# Patient Record
Sex: Female | Born: 1950 | Race: White | Hispanic: No | Marital: Married | Smoking: Never smoker
Health system: Southern US, Community
[De-identification: ages and names within clinical notes are randomized; demographics above are authoritative.]

## PROBLEM LIST (undated history)

## (undated) DIAGNOSIS — I10 Essential (primary) hypertension: Secondary | ICD-10-CM

## (undated) DIAGNOSIS — E785 Hyperlipidemia, unspecified: Secondary | ICD-10-CM

## (undated) DIAGNOSIS — R7303 Prediabetes: Secondary | ICD-10-CM

## (undated) HISTORY — DX: Prediabetes: R73.03

## (undated) HISTORY — PX: LAPAROSCOPIC NEPHRECTOMY: SUR781

## (undated) HISTORY — PX: MOLE REMOVAL: SHX2046

## (undated) HISTORY — DX: Essential (primary) hypertension: I10

## (undated) HISTORY — DX: Hyperlipidemia, unspecified: E78.5

## (undated) HISTORY — PX: HERNIA REPAIR: SHX51

---

## 1999-08-01 ENCOUNTER — Other Ambulatory Visit: Admission: RE | Admit: 1999-08-01 | Discharge: 1999-08-01 | Payer: Self-pay | Admitting: Obstetrics and Gynecology

## 1999-08-23 ENCOUNTER — Encounter (INDEPENDENT_AMBULATORY_CARE_PROVIDER_SITE_OTHER): Payer: Self-pay | Admitting: Specialist

## 1999-08-23 ENCOUNTER — Ambulatory Visit (HOSPITAL_COMMUNITY): Admission: RE | Admit: 1999-08-23 | Discharge: 1999-08-23 | Payer: Self-pay | Admitting: Obstetrics and Gynecology

## 1999-09-25 ENCOUNTER — Encounter: Admission: RE | Admit: 1999-09-25 | Discharge: 1999-09-25 | Payer: Self-pay | Admitting: Obstetrics and Gynecology

## 1999-09-25 ENCOUNTER — Encounter: Payer: Self-pay | Admitting: Obstetrics and Gynecology

## 2000-08-12 ENCOUNTER — Other Ambulatory Visit: Admission: RE | Admit: 2000-08-12 | Discharge: 2000-08-12 | Payer: Self-pay | Admitting: Obstetrics and Gynecology

## 2001-11-22 ENCOUNTER — Other Ambulatory Visit: Admission: RE | Admit: 2001-11-22 | Discharge: 2001-11-22 | Payer: Self-pay | Admitting: Obstetrics and Gynecology

## 2001-11-24 ENCOUNTER — Encounter: Admission: RE | Admit: 2001-11-24 | Discharge: 2001-11-24 | Payer: Self-pay | Admitting: Urology

## 2001-11-24 ENCOUNTER — Encounter: Payer: Self-pay | Admitting: Urology

## 2001-12-20 ENCOUNTER — Encounter: Payer: Self-pay | Admitting: Urology

## 2001-12-27 ENCOUNTER — Encounter (INDEPENDENT_AMBULATORY_CARE_PROVIDER_SITE_OTHER): Payer: Self-pay | Admitting: Specialist

## 2001-12-27 ENCOUNTER — Inpatient Hospital Stay (HOSPITAL_COMMUNITY): Admission: RE | Admit: 2001-12-27 | Discharge: 2002-01-01 | Payer: Self-pay | Admitting: Urology

## 2003-01-10 ENCOUNTER — Other Ambulatory Visit: Admission: RE | Admit: 2003-01-10 | Discharge: 2003-01-10 | Payer: Self-pay | Admitting: Obstetrics and Gynecology

## 2004-10-30 ENCOUNTER — Encounter: Admission: RE | Admit: 2004-10-30 | Discharge: 2004-10-30 | Payer: Self-pay | Admitting: Obstetrics and Gynecology

## 2006-04-29 ENCOUNTER — Encounter: Admission: RE | Admit: 2006-04-29 | Discharge: 2006-04-29 | Payer: Self-pay | Admitting: Obstetrics and Gynecology

## 2007-05-04 ENCOUNTER — Encounter: Admission: RE | Admit: 2007-05-04 | Discharge: 2007-05-04 | Payer: Self-pay | Admitting: Obstetrics and Gynecology

## 2007-07-24 ENCOUNTER — Inpatient Hospital Stay (HOSPITAL_COMMUNITY): Admission: RE | Admit: 2007-07-24 | Discharge: 2007-07-25 | Payer: Self-pay | Admitting: General Surgery

## 2008-06-21 ENCOUNTER — Encounter: Admission: RE | Admit: 2008-06-21 | Discharge: 2008-06-21 | Payer: Self-pay | Admitting: Obstetrics and Gynecology

## 2009-07-04 ENCOUNTER — Encounter: Admission: RE | Admit: 2009-07-04 | Discharge: 2009-07-04 | Payer: Self-pay | Admitting: Obstetrics and Gynecology

## 2010-07-10 ENCOUNTER — Encounter: Admission: RE | Admit: 2010-07-10 | Discharge: 2010-07-10 | Payer: Self-pay | Admitting: Obstetrics

## 2010-07-22 ENCOUNTER — Encounter
Admission: RE | Admit: 2010-07-22 | Discharge: 2010-07-22 | Payer: Self-pay | Source: Home / Self Care | Attending: Obstetrics | Admitting: Obstetrics

## 2010-08-28 ENCOUNTER — Encounter
Admission: RE | Admit: 2010-08-28 | Discharge: 2010-08-28 | Payer: Self-pay | Source: Home / Self Care | Attending: Obstetrics and Gynecology | Admitting: Obstetrics and Gynecology

## 2010-12-24 NOTE — Discharge Summary (Signed)
NAME:  Sherri Kelley, Sherri Kelley                  ACCOUNT NO.:  [id number]   MEDICAL RECORD NO.:  [id number]          PATIENT TYPE:  OIB   LOCATION:  1534                         FACILITY:  [facility]   PHYSICIAN:  Ardeth Sportsman, MD     DATE OF BIRTH:  May 22, 1951   DATE OF ADMISSION:  07/23/2007  DATE OF DISCHARGE:                               DISCHARGE SUMMARY   PRIMARY CARE PHYSICIAN:  Dr. Evalee Jefferson at [facility].  That is the  facility she tends to go to.   UROLOGIST:  Bertram Millard. Dahlstedt, M.D.   SURGEON:  Dr. Jaclynn Guarneri.   DISCHARGE DIAGNOSIS:  Ventral and periumbilical incisional hernia.   PROCEDURES PERFORMED:  Laparoscopic ventral wall and periumbilical  hernia repair on July 23, 2007, by Dr. Johna Sheriff.  Other diagnoses  include prior C-section and status post right nephrectomy for  angiomyolipoma in 2003.   HOSPITAL COURSE:  Sherri Kelley is a pleasant 60 year old female who  developed an incisional hernia from her prior right nephrectomy as well  as a periumbilical hernia.  She underwent laparoscopic repair of this.  Postoperatively, she required IV pain medications.  However, by  postoperative day #2 she was walking the hallways well with adequate  control on oral ibuprofen and Percocet.  She was having flatus and  tolerating a solid diet.   The patient is improved and felt safe to be discharged home with the  following instructions:  1. She is to return to the clinic to see Dr. Johna Sheriff in about to      weeks.  2. She should remain physically active, doing low impact activity as      tolerated.  She should avoid any heavy lifting greater than 23      pounds for at least the next couple weeks, and avoid driving until      she is off narcotics and can actually protect herself on the road,      which probably will not be for at least 4-5 days.   She can discharge home with Tylox 1-2 p.o. q.4 h p.r.n. pain, Ibuprofen  400-800 mg p.o. q. 7 p.r.n. pain, Tylenol p.r.n. pain, ice pack  p.r.n.  pain, heating pad p.r.n. pain, abdominal binder or girdle p.r.n. pain.  She can resume her home medications to include the calcium and magnesium  and vitamin C daily as well as Echinacea b.i.d.   She should call if she has any fevers, chills, sweats, nausea, vomiting,  worsening of pain or other concerns.  She and her nurse agreed with this  plan.      Ardeth Sportsman, MD  Electronically Signed     SCG/MEDQ  D:  07/25/2007  T:  07/26/2007  Job:  [id number]

## 2010-12-24 NOTE — Op Note (Signed)
NAME:  Sherri Kelley, Sherri Kelley                  ACCOUNT NO.:  [id number]   MEDICAL RECORD NO.:  [id number]          PATIENT TYPE:  AMB   LOCATION:  DAY                          FACILITY:  [facility]   PHYSICIAN:  Sharlet Salina T. Hoxworth, M.D.DATE OF BIRTH:  11-21-1950   DATE OF PROCEDURE:  07/23/2007  DATE OF DISCHARGE:                               OPERATIVE REPORT   PREOPERATIVE DIAGNOSIS:  Ventral incisional and umbilical hernias.   POSTOPERATIVE DIAGNOSIS:  Ventral incisional and umbilical hernias.   SURGICAL PROCEDURE:  Laparoscopic repair of ventral incisional and  umbilical hernia.   SURGEON:  Lorne Skeens. Hoxworth, M.D.   ANESTHESIA:  General.   BRIEF HISTORY:  Sherri Kelley is a 60 year old female with a history of a  right nephrectomy through a right upper quadrant transverse incision  across the midline.  She recently presented with some intermittent mid  abdominal pain and a CT scan was performed.  This shows a moderate sized  3-4 cm midline ventral incisional hernia at the site of her previous  transverse incision as well as a small primary umbilical hernia.  Examination in the office reveals possibly a subtle mass near the hernia  although she is obese and physical exam is difficult.  With this  constellation of findings, I have recommended proceeding with  laparoscopic repair of both of these hernias.  The nature of the  procedure, indications, risks of bleeding, infection, possibly for open  repair and recurrence were discussed and understood.  She is now brought  to the operating room for this procedure.   DESCRIPTION OF OPERATION:  The patient was brought to the operating room  and placed in supine position on the operating table and general  orotracheal anesthesia was induced.  Foley catheter was placed.  She was  given preoperative IV antibiotics.  The abdomen was widely sterilely  prepped and draped.  Correct patient/procedure were verified.  Access  was obtained in the left upper  quadrant over toward the flank without  difficulty with an 11 mm Optiview trocar and pneumoperitoneum  established.  There were actually minimal adhesions of omentum up to the  old incision.  Under direct vision two 5 mm trocars were placed in the  left lateral abdomen.  Complete adhesiolysis was then performed taking  down omental adhesions from the right upper quadrant incision along its  entirety over toward the right flank.  Just at about the lower portion  of the falciform ligament where the incision crossed the midline there  was an approximately 3 cm diameter hernia defect.  A good sized portion  of preperitoneal fat was reduced from the defect and then the falciform  ligament was taken down 4 or 5 cm superiorly to allow fixation of the  mesh.  On careful palpation externally there was also confirmed to be a  small umbilical hernia.  Preperitoneal fat was taken down all around  this.  Preperitoneal fat reduced from the hernia defect in anticipation  of repair.  These 2 defects were close enough together that it seemed  most reasonable to repair both with a  single large piece of mesh rather  than 2 separate pieces.  This required a 20 x 15 cm oval piece of  Proceed.  Fixation sutures were placed around the edge at 6 points  equidistance.  Points on the anterior abdominal wall were initially  marked for these sutures.  The mesh was introduced into the abdominal  cavity, unfurled and oriented.  Initially the most superior and inferior  2 fixation points were used making tiny stab incisions and using the  Storz suture grabber to bring the superior and inferior midline sutures  up through the abdominal wall.  With these pulled taut I then discerned  the exact position of the other 4 lateral sutures on the abdominal wall  anteriorly and mesh was placed back down on the omentum and the Storz  suture grabber through small incisions again was used to retrieve the  final 4 sutures.  The mesh  was then brought up to the anterior abdominal  wall and the suture secured with very nice smooth deployment of the mesh  and wide coverage of the defects several centimeters at least 4  superiorly and inferiorly and a number of centimeters laterally.  Following this with the use of 2 additional 5 mm trocars in the right  flank to visualize the edge of the mesh circumferentially it was tacked  circumferentially with the Endo tacker and several tacks then placed  also more centrally around the larger defect superiorly.  This provided  nice broad coverage.  The abdomen was inspected for hemostasis.  This  appeared complete.  All CO2 was evacuated and trocars were removed.  The  skin incisions were closed with interrupted subcuticular 4-0 Monocryl  and Dermabond.  Sponges, needle and instrument counts were correct.  The  patient was taken to recovery in good condition.      Lorne Skeens. Hoxworth, M.D.  Electronically Signed     BTH/MEDQ  D:  07/23/2007  T:  07/24/2007  Job:  [id number]

## 2010-12-27 NOTE — Op Note (Signed)
[facility]  Patient:    Sherri Kelley                          MRN: [id number] Proc. Date: 08/23/99 Adm. Date:  [id number] Attending:  Morene Antu                           Operative Report  PREOPERATIVE DIAGNOSIS:       Menorrhagia, thickened endometrium.  POSTOPERATIVE DIAGNOSIS:      Menorrhagia, thickened endometrium.  OPERATION:                    D&C, hysteroscopy with resectoscopic excision of polypoid tissue.  SURGEON:                      Sherry A. Rosalio Macadamia, M.D.  ASSISTANT:  ANESTHESIA:                   MAC.  ESTIMATED BLOOD LOSS:  INDICATIONS:                  This is a 60 year old, gravida 2, para 2-0-0-2, woman who has had irregular and excessively heavy periods.  The patient was evaluated in the office after a menstrual period showing thickened endometrium with probable  endometrial polyps present.  Because of this, the patient is brought to the operating room for [facility] hysteroscopy with resectoscope.  FINDINGS:                     Normal size, anteflexed uterus with polypoid tissue in the endometrial cavity.  DESCRIPTION OF PROCEDURE:     The patient was brought into the operating room and given adequate IV sedation.  She was placed in the dorsal lithotomy position. er perineum and vagina were washed with Betadine.  The bladder was in-and-out catheterized.  The patient was draped in a sterile fashion.  Speculum was placed within the vagina.  The vagina was washed with Betadine.  Paracervical block was administered with 1% Nesocaine.  The anterior lip of the cervix was grasped with a single tooth tenaculum.  The cervix was sounded.  The cervix was dilated with Pratt dilators to a #31.  Hysteroscope was easily introduced into the endometrial cavity. Pictures were obtained.  Using a double loupe right angle resector at 190 watts, the thickened polypoid tissue was removed in sheets.  Small bleeders  were cauterized.  Pictures were obtained.  Adequate hemostasis was present.  All instruments were then removed from the vagina.  The patient was taken out of the dorsal lithotomy position.  She was awakened.  She was removed from the operating table to a stretcher in stable condition.  Complications were none.  Estimated blood loss was less than 5 cc.  Sorbitol differential -20 cc. DD:  08/23/99 TD:  08/24/99 Job: [id number]

## 2010-12-27 NOTE — Op Note (Signed)
[facility]  Patient:    Sherri Kelley, Sherri Kelley Visit Number: [id number] MRN: [id number]          Service Type: SUR Location: 3W 0363 01 Attending Physician:  Liborio Nixon Dictated by:   Bertram Millard. Dahlstedt, M.D. Proc. Date: 12/27/01 Admit Date:  12/27/2001   CC:         _____________   Operative Report  PREOPERATIVE DIAGNOSIS:  Large right renal mass.  POSTOPERATIVE DIAGNOSIS:  Large right renal mass.  PRINCIPAL PROCEDURE:  Attempted right partial, eventual right radical nephrectomy.  SURGEON:  Bertram Millard. Dahlstedt, M.D.  FIRST ASSISTANT:  Verl Dicker, M.D.  COMPLICATIONS:  None.  ANESTHESIA:  General endotracheal.  ESTIMATED BLOOD LOSS:  750 cc.  TOTAL FLUID REPLACEMENT:  2500 cc crystalloid, 500 Hespan.  BRIEF HISTORY:  This 60 year old female has a large right renal mass.  This is most likely an angiomyolipoma.  This was picked up on a physical exam by Dr. Floyde Parkins.  Follow-up ultrasound and CT revealed a 13-14 cm right lower pole mass.  CT characteristics revealed fat consistency.  It was felt that this most like an angiomyolipoma.  Interestingly, the patient has a sister, who I follow with an AML.  That one is small.  This one is so large, that I have recommended removing the tumor and possibly the kidney due to its large size and potential for bleeding. Risks and complications as well as alternatives of following this kidney and its mass were discussed with the patient and her husband at length.  They have decided to proceed.  They are aware of risks and complications including but no limited to bleeding, infection, need for transfusion, pneumonia, DVT, PE, other cardiac events.  They have decided to proceed.  DESCRIPTION OF PROCEDURE:  The patient was administered preoperative IV antibiotics taken to the operating room where general anesthetic was administered.  She was placed in the supine position with  a bump under the right flank.  An incision was made in the right subcostal area and carried down to peritoneum with electrocautery.  The mass was quite evident, protruding through the wound.  Colonic adhesions to the gallbladder were taken down with sharp dissection.  The right colon was mobilized.  The duodenum was Kocherized.  A huge right ovarian vein was seen coming into the vena cava. This was clipped and divided.  It was quite evident that there were very large veins circumnavigating the tumor.  Using gentle retraction, the vena cava was eventually identified. The entire lower pole of the mass was freed from the peritoneum with electrocautery.  The renal vessels were quite high, and despite having a good incision high in the patients right upper quadrant, it was still difficult to identify these vessels due to the size of the tumor and the kidney being pushed superiorly.  After approximately an hour of dissecting this kidney out and barely seeing the renal vessels, it was evident that the tumor was growing in size with some internal bleeding.  I felt it necessary at this point to proceed with a radical nephrectomy rather than a partial nephrectomy, as I worried about the length of time involved with this procedure as well as the amount of blood loss.  Also, exposure to the renal vessels was difficult.  At this point, we dissected up along the vena cava and encountered the right renal vein.  This was encircled with a 0 silk and used as traction.  The renal artery was  lying posterior to this renal vein.  It was identified and clipped as well as suture ligated proximally.  It was clipped distally, and the artery was divided.  The tie on the vein was then cinched down, and another tie was placed laterally on the renal vein.  A clip was placed medially.  The vein was then divided.  We then mobilized the rest of the lower pole mass.  Dissection was then carried also up medially toward  the upper pole of the kidney.  No other vessels were seen.  The adrenal was left intact.  Peritoneal attachments of the upper pole of the kidney were divided with electrocautery.  The lateral and inferior aspects of the kidney were then freed up.  The kidney and the mass were then passed from the table.  Small bleeders were carefully controlled with the clips or electrocautery.  A clip was placed on the divided ureter.  The renal fossa was irrigated with saline.  No bleeding was seen.  Abdominal contents were allowed to fall in their anatomic positions.  Closure of the fascia was then performed in two layers using running #1 PDS suture.  Skin edges were reapproximated using skin clips.  Dry sterile dressings were placed.  Sponge, needle, and instrument counts were correct x 2.  The patient tolerated the procedure well.  She was awakened, extubated, and taken to the PACU in stable condition. Dictated by:   Bertram Millard. Dahlstedt, M.D. Attending Physician:  Liborio Nixon DD:  12/27/01 TD:  12/28/01 Job: [id number]

## 2010-12-27 NOTE — Discharge Summary (Signed)
[facility]  Patient:    Sherri Kelley, Sherri Kelley Visit Number: [id number] MRN: [id number]          Service Type: SUR Location: 3W 0363 01 Attending Physician:  Liborio Nixon Dictated by:   Bertram Millard. Dahlstedt, M.D. Admit Date:  12/27/2001 Discharge Date: 01/01/2002   CC:         Cordelia Pen A. Rosalio Macadamia, M.D.   Discharge Summary  PRIMARY DIAGNOSIS:  Angiomyolipoma of right kidney.  PRINCIPAL PROCEDURE:  Right nephrectomy.  HISTORY OF PRESENT ILLNESS:  The patient is a 60 year old female with a right renal mass.  This was discovered by Dr. Rosalio Macadamia on routine physical examination.  Followup CT scan revealed a large right renal mass approximately 15 cm in size.  This had characteristics consistent with angiomyolipoma.  The patient underwent thorough evaluation, and no evidence of any metastatic disease was seen.  It was recommended that she undergo excision of this mass, and she presents at this time for that procedure.  HOSPITAL COURSE:  The patient was admitted directly to the operating room where she underwent right radial nephrectomy.  Partial nephrectomy was attempted, but the mass was so large this precluded attempts at selective tissue removal.  She did well, and postoperatively had a stable recovery.  She remained afebrile.  Incision was healing well.  She was advanced to a regular diet.  She was followed with her hematocrit being approximately 24% at the time of discharge.  She was in improved condition on 01/01/02, and was discharged at that time.  FOLLOWUP:  She will follow up in my office in approximately one week.  DISCHARGE MEDICATIONS:  Vicodin one or two p.o. q.4h. p.r.n. pain.  Activity instructions were discussed as well as wound care.Dictated by: Bertram Millard. Dahlstedt, M.D. Attending Physician:  Liborio Nixon DD:  01/31/02 TD:  02/01/02 Job: [id number]

## 2010-12-27 NOTE — H&P (Signed)
[facility]  Patient:    [name], Kelley Visit Number: [id number] MRN: [id number]          Service Type: SUR Location: 3W 0363 01 Attending Physician:  Sherri Kelley Dictated by:   Sherri Millard. Kelley, M.D. Admit Date:  12/27/2001   CC:         Sherri Kelley, M.D.   History and Physical  REASON FOR ADMISSION:  Right renal mass.  BRIEF HISTORY:  [age]-year-old female is admitted for surgical removal of a right renal mass.  This was discovered by Dr. Eliberto Ivory. Kelley on abdominal examination.  Followup studies revealed a 14-cm right renal mass; this was consistent with an angiomyolipoma.  The patient is aware of the benign but growing nature of this tumor.  The patient has a sister who I follow with an AML; that is much smaller.  Due to the size of this mass, it was recommended that the patient undergo excision of the tumor and possibly the kidney.  She presents at this time for that surgical procedure.  PAST MEDICAL HISTORY: 1. Cesarean delivery. 2. Vaginal delivery. 3. Status post D&C.  MEDICATIONS:  None.  ALLERGIES:  No known drug allergies.  SOCIAL HISTORY:  The patient is married.  She has two children who are teenagers.  There is no history of alcohol or tobacco use.  FAMILY HISTORY AND REVIEW OF SYSTEMS:  Noncontributory except for angiomyolipoma in her sister.  PHYSICAL EXAMINATION:  GENERAL:  Healthy-appearing, pleasant adult female.  VITAL SIGNS:  Blood pressure 110/60, pulse 80, respirations 16, temperature 98.7.  HEENT:  Normal.  NECK:  Supple without thyromegaly or adenopathy.  CHEST:  Clear bilaterally.  BREASTS:  Exam not performed.  HEART:  Regular rate and rhythm.  ABDOMEN:  Flat, soft, nondistended, nontender.  Large mobile mass in the right upper quadrant.  Bowel sounds present.  GU:  External genitalia normal.  EXTREMITIES:  Without cyanosis, clubbing or edema.  LABORATORY AND  ACCESSORY DATA:  Laboratory studies were normal.  Chest x-ray and EKG were normal.  IMPRESSION:  Large right renal mass, probable angiomyolipoma.  PLAN:  Possible right partial versus probable right total nephrectomy. Dictated by:   Sherri Millard. Kelley, M.D. Attending Physician:  Sherri Kelley DD:  12/27/01 TD:  12/28/01 Job: [id number]

## 2011-05-19 LAB — DIFFERENTIAL
Basophils Absolute: 0.1
Basophils Relative: 1
Lymphocytes Relative: 34
Monocytes Absolute: 0.7
Neutro Abs: 4.5
Neutrophils Relative %: 55

## 2011-05-19 LAB — COMPREHENSIVE METABOLIC PANEL
Albumin: 4.1
Alkaline Phosphatase: 133 — ABNORMAL HIGH
BUN: 13
Chloride: 105
Creatinine, Ser: 0.94
Glucose, Bld: 114 — ABNORMAL HIGH
Potassium: 4.6
Total Bilirubin: 1.1
Total Protein: 7.2

## 2011-05-19 LAB — URINALYSIS, ROUTINE W REFLEX MICROSCOPIC
Bilirubin Urine: NEGATIVE
Glucose, UA: NEGATIVE
Hgb urine dipstick: NEGATIVE
Ketones, ur: NEGATIVE
Protein, ur: NEGATIVE
Urobilinogen, UA: 0.2

## 2011-05-19 LAB — CBC
HCT: 39
Hemoglobin: 13.5
MCV: 86.5
Platelets: 281
RDW: 13

## 2011-06-24 ENCOUNTER — Other Ambulatory Visit: Payer: Self-pay | Admitting: Obstetrics

## 2011-06-24 DIAGNOSIS — Z1231 Encounter for screening mammogram for malignant neoplasm of breast: Secondary | ICD-10-CM

## 2011-08-01 ENCOUNTER — Ambulatory Visit
Admission: RE | Admit: 2011-08-01 | Discharge: 2011-08-01 | Disposition: A | Discharge status: Home / Self Care | Payer: BC Managed Care – PPO | Source: Ambulatory Visit | Attending: Obstetrics | Admitting: Obstetrics

## 2011-08-01 DIAGNOSIS — Z1231 Encounter for screening mammogram for malignant neoplasm of breast: Secondary | ICD-10-CM

## 2012-08-26 ENCOUNTER — Other Ambulatory Visit: Payer: Self-pay | Admitting: Obstetrics

## 2012-08-26 DIAGNOSIS — Z1231 Encounter for screening mammogram for malignant neoplasm of breast: Secondary | ICD-10-CM

## 2012-09-22 ENCOUNTER — Ambulatory Visit: Payer: BC Managed Care – PPO

## 2012-10-13 ENCOUNTER — Ambulatory Visit
Admission: RE | Admit: 2012-10-13 | Discharge: 2012-10-13 | Disposition: A | Discharge status: Home / Self Care | Payer: BC Managed Care – PPO | Source: Ambulatory Visit | Attending: Obstetrics | Admitting: Obstetrics

## 2012-10-13 DIAGNOSIS — Z1231 Encounter for screening mammogram for malignant neoplasm of breast: Secondary | ICD-10-CM

## 2013-09-15 ENCOUNTER — Other Ambulatory Visit: Payer: Self-pay

## 2013-09-15 DIAGNOSIS — Z1231 Encounter for screening mammogram for malignant neoplasm of breast: Secondary | ICD-10-CM

## 2013-10-19 ENCOUNTER — Ambulatory Visit: Payer: BC Managed Care – PPO

## 2014-01-11 ENCOUNTER — Encounter: Payer: BC Managed Care – PPO | Attending: Obstetrics | Admitting: Dietician

## 2014-01-11 ENCOUNTER — Encounter: Payer: Self-pay | Admitting: Dietician

## 2014-01-11 VITALS — Ht 63.0 in | Wt 202.6 lb

## 2014-01-11 DIAGNOSIS — E785 Hyperlipidemia, unspecified: Secondary | ICD-10-CM

## 2014-01-11 DIAGNOSIS — Z713 Dietary counseling and surveillance: Secondary | ICD-10-CM | POA: Insufficient documentation

## 2014-01-11 NOTE — Progress Notes (Signed)
  Medical Nutrition Therapy:  Appt start time: 945 end time:  1045.   Assessment:  Primary concerns today:  Sherri Kelley is here today with her husband. She states that her doctor referred her here for prediabetes. She lives with her husband; they have 2 adult children. 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 She eats out a "couple of times a week." At home her husband cooks and does the grocery shopping. [name] reports her dad and paternal grandfather had diabetes. She has recently done some reading about diabetes. Started taking phytomega and CoQ10 through a company called [employer]. She does not take any medications and wishes to avoid having to take medications in the future. Laporchia reports past success with Weight Watchers.  Preferred Learning Style:   No preference indicated   Learning Readiness:   Ready  MEDICATIONS: none; vitamin and mineral supplements only   DIETARY INTAKE:  Avoided foods include spicy foods.    24-hr recall:  B ( AM): egg on sandwich round with LF mayo OR yogurt and granola  Snk ( AM): fruit and nut bar  L ( PM): pimiento cheese or tuna sandwich or leftovers or Subway/Starbucks sandwich Snk ( PM): fruit D ( PM): baked cod or salmon with panko, roasted potatoes, coleslaw, asparagus or frozen pizza or spaghetti  Snk ( PM): popcorn, nuts, peanut butter crackers, cereal  Beverages: water, iced coffee, half sweet and half unsweet tea  Usual physical activity: lots of walking at work  Estimated energy needs: 1600-1800 calories 180-200 g carbohydrates 120-135 g protein 44-50 g fat  Progress Towards Goal(s):  No progress.   Nutritional Diagnosis:  Tucker-2.2 Altered nutrition-related laboratory As related to family history of diabetes, obesity, physical inactivity.  As evidenced by HgbA1c 6.2%.    Intervention:  Nutrition counseling provided. Discussed methods for preventing onset of diabetes. Goals:  -Start exercising  -Walking in the  evenings  -Look into a pedometer and set a step goal -Manage weight  -reduce snacking at night  -fill up on non-starchy vegetables -Watch portions!  -Practice reading food labels and measure portions  -Record intake   -Use small plates and bowls -Limit saturated fats -Healthy fats: fish, walnuts and almonds, avocados, olive oil -Increase fiber: fruits and vegetables, beans, whole grains  Teaching Method Utilized:  Visual Auditory  Handouts given during visit include:  MyPlate  Barriers to learning/adherence to lifestyle change: none  Demonstrated degree of understanding via:  Teach Back   Monitoring/Evaluation:  Dietary intake, exercise, and body weight in 2 month(s).

## 2014-01-11 NOTE — Patient Instructions (Addendum)
-  Start exercising  -Walking in the evenings  -Look into a pedometer and set a step goal  -Manage weight  -reduce snacking at night  -fill up on non-starchy vegetables  -Watch portions!  -Practice reading food labels and measure portions  -Record intake   -Use small plates and bowls  -Limit saturated fats -Healthy fats: fish, walnuts and almonds, avocados, olive oil  -Increase fiber: fruits and vegetables, beans, whole grains

## 2014-03-15 ENCOUNTER — Ambulatory Visit: Payer: BC Managed Care – PPO | Admitting: Dietician

## 2014-05-19 ENCOUNTER — Ambulatory Visit (HOSPITAL_COMMUNITY)
Admit: 2014-05-19 | Discharge: 2014-05-19 | Disposition: A | Discharge status: Home / Self Care | Payer: BC Managed Care – PPO | Source: Ambulatory Visit | Attending: Family Medicine | Admitting: Family Medicine

## 2014-05-19 ENCOUNTER — Emergency Department (INDEPENDENT_AMBULATORY_CARE_PROVIDER_SITE_OTHER)
Admission: EM | Admit: 2014-05-19 | Discharge: 2014-05-19 | Disposition: A | Discharge status: Home / Self Care | Payer: BC Managed Care – PPO | Source: Home / Self Care | Attending: Family Medicine | Admitting: Family Medicine

## 2014-05-19 ENCOUNTER — Encounter (HOSPITAL_COMMUNITY): Payer: Self-pay | Admitting: Emergency Medicine

## 2014-05-19 DIAGNOSIS — M940 Chondrocostal junction syndrome [Tietze]: Secondary | ICD-10-CM

## 2014-05-19 DIAGNOSIS — R0789 Other chest pain: Secondary | ICD-10-CM | POA: Diagnosis not present

## 2014-05-19 DIAGNOSIS — R1012 Left upper quadrant pain: Secondary | ICD-10-CM | POA: Diagnosis present

## 2014-05-19 LAB — POCT URINALYSIS DIP (DEVICE)
Bilirubin Urine: NEGATIVE
GLUCOSE, UA: NEGATIVE mg/dL
HGB URINE DIPSTICK: NEGATIVE
Ketones, ur: NEGATIVE mg/dL
Leukocytes, UA: NEGATIVE
NITRITE: NEGATIVE
PH: 7.5 (ref 5.0–8.0)
PROTEIN: NEGATIVE mg/dL
Specific Gravity, Urine: 1.015 (ref 1.005–1.030)
UROBILINOGEN UA: 0.2 mg/dL (ref 0.0–1.0)

## 2014-05-19 MED ORDER — TRAMADOL HCL 50 MG PO TABS: 50.0000 mg | ORAL_TABLET | Freq: Four times a day (QID) | ORAL | Status: DC | PRN

## 2014-05-19 NOTE — Discharge Instructions (Signed)
Thank you for coming in today. Take up to 2 Aleve twice daily for pain Take Tylenol for pain as well Use tramadol for severe pain. Do not drive after taking this medication.. Followup with primary care provider regarding your blood pressure.   PRIMARY CARE Paramedic at [place], [place] [phone]  Fax [phone]  Therapist, music at [facility] [address] [place], [place] Ph [phone]  Fax [phone]  Therapist, music at [place] / [place] [phone] [address], [place] Ph [phone]  Fax [phone]  [facility] at [facility] [address] [place]  [place], [place] Ph [phone]  Fax [phone]  [facility] [address] [place] [address] [place], [place] Ph [phone]  Fax [phone]  [facility] at [facility] [place], [place] Ph [phone]  Fax [phone]   [place] @ [place] [address] Phone: [phone]   [facility] @ [facility] [address] [place] [address] Phone: [place] @ [place] [place] [address] Phone: [place] @ [place]. [place] [address] Phone: [place] @ [place]. [employer], [place] [address] Phone: [phone]   [place] @ [place] [address] [place] [address] Phone: [phone]   Dr. Rachell Cipro [address] [place] [address] [phone]   Chest Wall Pain Chest wall pain is pain in or around the bones and muscles of your chest. It may take up to 6 weeks to get better. It may take longer if  you must stay physically active in your work and activities.  CAUSES  Chest wall pain may happen on its own. However, it may be caused by:  A viral illness like the flu.  Injury.  Coughing.  Exercise.  Arthritis.  Fibromyalgia.  Shingles. HOME CARE INSTRUCTIONS   Avoid overtiring physical activity. Try not to strain or perform activities that cause pain. This includes any activities using your chest or your abdominal and side muscles, especially if heavy weights are used.  Put ice on the sore area.  Put ice in a plastic bag.  Place a towel between your skin and the bag.  Leave the ice on for 15-20 minutes per hour while awake for the first 2 days.  Only take over-the-counter or prescription medicines for pain, discomfort, or fever as directed by your caregiver. SEEK IMMEDIATE MEDICAL CARE IF:   Your pain increases, or you are very uncomfortable.  You have a fever.  Your chest pain becomes worse.  You have new, unexplained symptoms.  You have nausea or vomiting.  You feel sweaty or lightheaded.  You have a cough with phlegm (sputum), or you cough up blood. MAKE SURE YOU:   Understand these instructions.  Will watch your condition.  Will get help right away if you are not doing well or get worse. Document Released: 07/28/2005 Document Revised: 10/20/2011 Document Reviewed: 03/24/2011 [facility] Patient Information 2015 [place], [place]. This information is not intended to replace advice given to you by your health care provider. Make sure you discuss any questions  you have with your health care provider.

## 2014-05-19 NOTE — ED Notes (Signed)
C/o left flank pain onset 1 week; yest night was worse Pain increases when breathing and w/pressure Denies f/v/n/d, urinary sx Alert, no signs of acute distress.

## 2014-05-19 NOTE — ED Notes (Signed)
Patient transported to X-ray 

## 2014-05-19 NOTE — ED Provider Notes (Signed)
Sherri Kelley is a 63 y.o. female who presents to Urgent Care today for left rib pain. Patient has a one-week history of left-sided flank to rib pain. The pain radiates around the left lower ribs laterally to anteriorly. The pain is worse with deep inspiration and better with rest. No exertional pain shortness of breath or palpitations. No rash. No fevers or chills. No medications tried yet. No urinary symptoms. Patient denies any injury history.  Past Medical History  Diagnosis Date  . Hyperlipidemia   . Prediabetes    History  Substance Use Topics  . Smoking status: Never Smoker   . Smokeless tobacco: Not on file  . Alcohol Use: Yes   ROS as above Medications: No current facility-administered medications for this encounter.   Current Outpatient Prescriptions  Medication Sig Dispense Refill  . traMADol (ULTRAM) 50 MG tablet Take 1 tablet (50 mg total) by mouth every 6 (six) hours as needed.  15 tablet  0    Exam:  BP 166/86  Pulse 74  Temp(Src) 97.1 F (36.2 C) (Oral)  Resp 16  SpO2 97% Gen: Well NAD HEENT: EOMI,  MMM Lungs: Normal work of breathing. CTABL Heart: RRR no MRG CHEST WALL : Symmetrical expansion bilaterally. Mildly tender to palpation left lower lateral ribs  Abd: NABS, Soft. Nondistended, Nontender Exts: Brisk capillary refill, warm and well perfused. Non-edematous. Nontender calves. Normal diameter equal bilaterally. Skin: No rash  Twelve-lead EKG shows normal sinus rhythm at 68 beats per minute. No ST segment elevation or depression. No significant Q waves.  Results for orders placed during the hospital encounter of 05/19/14 (from the past 24 hour(s))  POCT URINALYSIS DIP (DEVICE)     Status: None   Collection Time    05/19/14  9:44 AM      Result Value Ref Range   Glucose, UA NEGATIVE  NEGATIVE mg/dL   Bilirubin Urine NEGATIVE  NEGATIVE   Ketones, ur NEGATIVE  NEGATIVE mg/dL   Specific Gravity, Urine 1.015  1.005 - 1.030   Hgb urine dipstick NEGATIVE   NEGATIVE   pH 7.5  5.0 - 8.0   Protein, ur NEGATIVE  NEGATIVE mg/dL   Urobilinogen, UA 0.2  0.0 - 1.0 mg/dL   Nitrite NEGATIVE  NEGATIVE   Leukocytes, UA NEGATIVE  NEGATIVE   Dg Chest 2 View  05/19/2014   CLINICAL DATA:  Left upper abdominal and lower chest pain, pain with deep inspiration  EXAM: CHEST  2 VIEW  COMPARISON:  07/23/2007  FINDINGS: The heart size and mediastinal contours are within normal limits. Both lungs are clear. The visualized skeletal structures are unremarkable.  IMPRESSION: No active cardiopulmonary disease.   Electronically Signed   By: Kathreen Devoid   On: 05/19/2014 10:57    Assessment and Plan: 63 y.o. female with costochondritis most likely explanation.  Doubtful for serious chest pathology. Plan for tramadol home NSAIDs and Tylenol. Followup with PCP for blood pressure management.  Discussed warning signs or symptoms. Please see discharge instructions. Patient expresses understanding.     Gregor Hams, MD 05/19/14 [phone]

## 2014-07-11 ENCOUNTER — Encounter: Payer: Self-pay | Admitting: Internal Medicine

## 2014-07-11 ENCOUNTER — Other Ambulatory Visit (INDEPENDENT_AMBULATORY_CARE_PROVIDER_SITE_OTHER): Payer: BC Managed Care – PPO

## 2014-07-11 ENCOUNTER — Ambulatory Visit (INDEPENDENT_AMBULATORY_CARE_PROVIDER_SITE_OTHER): Payer: BC Managed Care – PPO | Admitting: Internal Medicine

## 2014-07-11 VITALS — BP 134/86 | HR 88 | Ht 63.0 in | Wt 209.2 lb

## 2014-07-11 DIAGNOSIS — J90 Pleural effusion, not elsewhere classified: Secondary | ICD-10-CM

## 2014-07-11 DIAGNOSIS — R05 Cough: Secondary | ICD-10-CM

## 2014-07-11 DIAGNOSIS — R059 Cough, unspecified: Secondary | ICD-10-CM

## 2014-07-11 LAB — CBC WITH DIFFERENTIAL/PLATELET
BASOS ABS: 0.1 10*3/uL (ref 0.0–0.1)
BASOS PCT: 0.6 % (ref 0.0–3.0)
EOS ABS: 0.2 10*3/uL (ref 0.0–0.7)
Eosinophils Relative: 2 % (ref 0.0–5.0)
HCT: 38.4 % (ref 36.0–46.0)
Hemoglobin: 12.7 g/dL (ref 12.0–15.0)
Lymphocytes Relative: 29.4 % (ref 12.0–46.0)
Lymphs Abs: 3 10*3/uL (ref 0.7–4.0)
MCHC: 33.1 g/dL (ref 30.0–36.0)
MCV: 87.8 fl (ref 78.0–100.0)
Monocytes Absolute: 0.9 10*3/uL (ref 0.1–1.0)
Monocytes Relative: 9 % (ref 3.0–12.0)
NEUTROS PCT: 59 % (ref 43.0–77.0)
Neutro Abs: 6 10*3/uL (ref 1.4–7.7)
Platelets: 312 10*3/uL (ref 150.0–400.0)
RBC: 4.37 Mil/uL (ref 3.87–5.11)
RDW: 12.7 % (ref 11.5–15.5)
WBC: 10.1 10*3/uL (ref 4.0–10.5)

## 2014-07-11 LAB — BRAIN NATRIURETIC PEPTIDE: Pro B Natriuretic peptide (BNP): 51 pg/mL (ref 0.0–100.0)

## 2014-07-11 LAB — SEDIMENTATION RATE: Sed Rate: 37 mm/hr — ABNORMAL HIGH (ref 0–22)

## 2014-07-11 NOTE — Patient Instructions (Signed)
Try pepcid ac 20 mg at bedtime along with   chlortrimeton (chlorpheniramine) 4 mg x 2 at bedtime to see if eliminates your night time cough  GERD (REFLUX)  is an extremely common cause of respiratory symptoms just like yours , many times with no obvious heartburn at all.    It can be treated with medication, but also with lifestyle changes including avoidance of late meals, excessive alcohol, smoking cessation, and avoid fatty foods, chocolate, peppermint, colas, red wine, and acidic juices such as orange juice.  NO MINT OR MENTHOL PRODUCTS SO NO COUGH DROPS  USE SUGARLESS CANDY INSTEAD (Jolley ranchers or Stover's or Life Savers) or even ice chips will also do - the key is to swallow to prevent all throat clearing. NO OIL BASED VITAMINS - use powdered substitutes.   Please see patient coordinator before you leave today  to schedule venous dopplers next available  Please remember to go to the lab  department downstairs for your tests - we will call you with the results when they are available.  Please schedule a follow up office visit in 2  weeks, sooner if needed with cxr

## 2014-07-11 NOTE — Progress Notes (Signed)
Quick Note:  Spoke with pt and notified of results per Dr. Wert. Pt verbalized understanding and denied any questions.  ______ 

## 2014-07-11 NOTE — Progress Notes (Signed)
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 Pulmonary Consult    Referred by Dr. Brayton Mars for eval of abnormal ct chest.  Pt states that she was having right side pain and so ct abd was done and then ct chest. Pt c/o non prod cough for the past month or two- non prod and only at night.   coughing x sev week prior to onset of classcially pleuritic L CP 05/19/14  with no fever or chills, minimal residual dry noct cough all that's bothering her now  Last beach trip middle September 2015 with no h/o leg swelling dvt or bcps or fm hx clotting  No obvious day to day or daytime variabilty or assoc chronic cough or cp or chest tightness, subjective wheeze overt sinus or hb symptoms. No unusual exp hx or h/o childhood pna/ asthma or knowledge of premature birth.  Sleeping ok without nocturnal  or early am exacerbation  of respiratory  c/o's or need for noct saba. Also denies any obvious fluctuation of symptoms with weather or environmental changes or other aggravating or alleviating factors except as outlined above   Current Medications, Allergies, Complete Past Medical History, Past Surgical History, Family History, and Social History were reviewed in [employer] record.  ROS  The following are not active complaints unless bolded sore throat, dysphagia, dental problems, itching, sneezing,  nasal congestion or excess/ purulent secretions, ear ache,   fever,  chills, sweats, unintended wt loss, pleuritic or exertional cp, hemoptysis,  orthopnea pnd or leg swelling, presyncope, palpitations, heartburn, abdominal pain, anorexia, nausea, vomiting, diarrhea  or change in bowel or urinary habits, change in stools or urine, dysuria,hematuria,  rash, arthralgias, visual complaints, headache, numbness weakness or ataxia or problems with walking or coordination,  change in mood/affect or memory.          Review of Systems     Objective:   Physical Exam amb wf nad  Wt Readings from Last 3 Encounters:  01/11/14 202 lb 9.6 oz (91.899 kg)    Vital signs reviewed   HEENT: nl dentition, turbinates, and orophanx. Nl external ear canals without cough reflex   NECK :  without JVD/Nodes/TM/ nl carotid upstrokes bilaterally   LUNGS: no acc muscle use, clear to A and P bilaterally without cough on insp or exp maneuvers   CV:  RRR  no s3 or murmur or increase in P2, no edema   ABD:  soft and nontender with nl excursion in the supine position. No bruits or organomegaly, bowel sounds nl  MS:  warm without deformities, calf tenderness, cyanosis or clubbing  SKIN: warm and dry without lesions    NEURO:  alert, approp, no deficits   cxr 05/19/14  No active cardiopulmonary disease.  Lab Results  Component Value Date   ESRSEDRATE 37* 07/11/2014     Lab Results  Component Value  Date   WBC 10.1 07/11/2014   HGB 12.7 07/11/2014   HCT 38.4 07/11/2014   MCV 87.8 07/11/2014   PLT 312.0 07/11/2014       Lab Results  Component Value Date   PROBNP 51.0 07/11/2014     Assessment:

## 2014-07-13 ENCOUNTER — Encounter: Payer: Self-pay | Admitting: Internal Medicine

## 2014-07-13 ENCOUNTER — Other Ambulatory Visit: Payer: BC Managed Care – PPO

## 2014-07-13 ENCOUNTER — Telehealth: Payer: Self-pay | Admitting: *Deleted

## 2014-07-13 DIAGNOSIS — R05 Cough: Secondary | ICD-10-CM | POA: Insufficient documentation

## 2014-07-13 DIAGNOSIS — J90 Pleural effusion, not elsewhere classified: Secondary | ICD-10-CM

## 2014-07-13 DIAGNOSIS — R059 Cough, unspecified: Secondary | ICD-10-CM | POA: Insufficient documentation

## 2014-07-13 LAB — D-DIMER, QUANTITATIVE: D-Dimer, Quant: 1.4 ug/mL-FEU — ABNORMAL HIGH (ref 0.00–0.48)

## 2014-07-13 NOTE — Telephone Encounter (Signed)
-----   Message from Tanda Rockers, MD sent at 07/13/2014  7:44 AM EST -----   ----- Message -----    From: Tanda Rockers, MD    Sent: 07/13/2014   7:06 AM      To: Tanda Rockers, MD  Make sure we either expedite the venous dopplers to this week or have the pt come by for d dimer today

## 2014-07-13 NOTE — Telephone Encounter (Signed)
Dopplers not set up until 07/19/14  Per MW needs to come in for d-dimer this wk LMTCB

## 2014-07-13 NOTE — Telephone Encounter (Signed)
Pt reports she went to lab and no order in. I have placed d dimer. Nothing further needed

## 2014-07-13 NOTE — Assessment & Plan Note (Signed)
Can be seen in pe but would be an unusual symptom in the absence of any doe at all  The most common causes of chronic cough in immunocompetent adults include the following: upper airway cough syndrome (UACS), previously referred to as postnasal drip syndrome (PNDS), which is caused by variety of rhinosinus conditions; (2) asthma; (3) GERD; (4) chronic bronchitis from cigarette smoking or other inhaled environmental irritants; (5) nonasthmatic eosinophilic bronchitis; and (6) bronchiectasis.   These conditions, singly or in combination, have accounted for up to 94% of the causes of chronic cough in prospective studies.   Other conditions have constituted no >6% of the causes in prospective studies These have included bronchogenic carcinoma, chronic interstitial pneumonia, sarcoidosis, left ventricular failure, ACEI-induced cough, and aspiration from a condition associated with pharyngeal dysfunction.    Chronic cough is often simultaneously caused by more than one condition. A single cause has been found from 38 to 82% of the time, multiple causes from 18 to 62%. Multiply caused cough has been the result of three diseases up to 42% of the time.       Most likely this is  Classic Upper airway cough syndrome, so named because it's frequently impossible to sort out how much is  CR/sinusitis with freq throat clearing (which can be related to primary GERD)   vs  causing  secondary (" extra esophageal")  GERD from wide swings in gastric pressure that occur with throat clearing, often  promoting self use of mint and menthol lozenges that reduce the lower esophageal sphincter tone and exacerbate the problem further in a cyclical fashion.   These are the same pts (now being labeled as having "irritable larynx syndrome" by some cough centers) who not infrequently have a history of having failed to tolerate ace inhibitors,  dry powder inhalers or biphosphonates or report having atypical reflux symptoms that don't  respond to standard doses of PPI , and are easily confused as having aecopd or asthma flares by even experienced allergists/ pulmonologists.  Try noct acid suppression/ h1 per guidelines and regroup in 2 weeks

## 2014-07-13 NOTE — Assessment & Plan Note (Signed)
Pattern in retrospect is worrisome for small peripheral PE, the hardest to detect even with CTa and d dimer, which are,even when acute, not sensitive enough to pick up the small ones.  The other ddx given the severe cough x 2 weeks prior is late parapneumonic or even traumatic hemothorax, but these seem less likely .   Best option now is eval legs to be sure doesn't have occult dvt and f/u in 2 weeks

## 2014-07-14 ENCOUNTER — Encounter (HOSPITAL_COMMUNITY): Payer: Self-pay

## 2014-07-14 ENCOUNTER — Other Ambulatory Visit: Payer: Self-pay | Admitting: Internal Medicine

## 2014-07-14 ENCOUNTER — Ambulatory Visit (HOSPITAL_COMMUNITY)
Admission: RE | Admit: 2014-07-14 | Discharge: 2014-07-14 | Disposition: A | Discharge status: Home / Self Care | Payer: BC Managed Care – PPO | Source: Ambulatory Visit | Attending: Internal Medicine | Admitting: Internal Medicine

## 2014-07-14 ENCOUNTER — Ambulatory Visit (HOSPITAL_COMMUNITY): Admission: RE | Admit: 2014-07-14 | Payer: BC Managed Care – PPO | Source: Ambulatory Visit

## 2014-07-14 DIAGNOSIS — J90 Pleural effusion, not elsewhere classified: Secondary | ICD-10-CM

## 2014-07-14 MED ORDER — IOHEXOL 350 MG/ML SOLN
100.0000 mL | Freq: Once | INTRAVENOUS | Status: AC | PRN
  Administered 2014-07-14: 100 mL via INTRAVENOUS

## 2014-07-14 NOTE — Progress Notes (Signed)
Quick Note:  Spoke with pt and notified of results per Dr. Wert. Pt verbalized understanding and denied any questions.  ______ 

## 2014-07-14 NOTE — Addendum Note (Signed)
Addended by: Inge Rise on: 07/14/2014 04:39 PM   Modules accepted: Orders

## 2014-07-14 NOTE — Progress Notes (Signed)
Quick Note:  LMTCB ______ 

## 2014-07-17 ENCOUNTER — Ambulatory Visit (HOSPITAL_COMMUNITY): Payer: BC Managed Care – PPO | Attending: Internal Medicine | Admitting: Cardiology

## 2014-07-17 DIAGNOSIS — R791 Abnormal coagulation profile: Secondary | ICD-10-CM

## 2014-07-17 DIAGNOSIS — J90 Pleural effusion, not elsewhere classified: Secondary | ICD-10-CM | POA: Diagnosis present

## 2014-07-17 DIAGNOSIS — R7989 Other specified abnormal findings of blood chemistry: Secondary | ICD-10-CM

## 2014-07-17 LAB — POCT I-STAT CREATININE: Creatinine, Ser: 1 mg/dL (ref 0.50–1.10)

## 2014-07-17 NOTE — Progress Notes (Signed)
Bilateral lower venous duplex performed  

## 2014-07-19 ENCOUNTER — Encounter (HOSPITAL_COMMUNITY): Payer: BC Managed Care – PPO

## 2014-07-19 NOTE — Progress Notes (Signed)
Quick Note:  Spoke with pt and notified of results per Dr. Wert. Pt verbalized understanding and denied any questions.  ______ 

## 2014-07-25 ENCOUNTER — Encounter: Payer: Self-pay | Admitting: Internal Medicine

## 2014-07-25 ENCOUNTER — Ambulatory Visit (INDEPENDENT_AMBULATORY_CARE_PROVIDER_SITE_OTHER): Payer: BC Managed Care – PPO | Admitting: Internal Medicine

## 2014-07-25 ENCOUNTER — Ambulatory Visit (INDEPENDENT_AMBULATORY_CARE_PROVIDER_SITE_OTHER)
Admission: RE | Admit: 2014-07-25 | Discharge: 2014-07-25 | Disposition: A | Discharge status: Home / Self Care | Payer: BC Managed Care – PPO | Source: Ambulatory Visit | Attending: Internal Medicine | Admitting: Internal Medicine

## 2014-07-25 VITALS — BP 130/78 | HR 70 | Ht 63.0 in | Wt 209.0 lb

## 2014-07-25 DIAGNOSIS — R05 Cough: Secondary | ICD-10-CM

## 2014-07-25 DIAGNOSIS — J90 Pleural effusion, not elsewhere classified: Secondary | ICD-10-CM

## 2014-07-25 DIAGNOSIS — R059 Cough, unspecified: Secondary | ICD-10-CM

## 2014-07-25 NOTE — Progress Notes (Signed)
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   History of Present Illness  07/11/2014 1st [facility] office visit/ Sherri Kelley   Chief Complaint  Patient presents with  . Pulmonary Consult    Referred by Dr. Brayton Mars for eval of abnormal ct chest.  Pt states that she was having right side pain and so ct abd was done and then ct chest. Pt c/o non prod cough for the past month or two- non prod and only at night.   coughing x sev week prior to onset of classcally pleuritic L CP 05/19/14  with no fever or chills, minimal residual dry noct cough all that's bothering her now Last beach trip middle September 2015 with no h/o leg swelling dvt or bcps or fm hx clotting rec Try pepcid ac 20 mg at bedtime along with   chlortrimeton (chlorpheniramine) 4 mg x 2 at bedtime to see if eliminates your night time cough  GERD . schedule venous dopplers next available> neg  Please remember to go to the lab  Department> pos d dimer > CTa > neg x small effusion/atx  07/25/2014 f/u ov/Antavius Sperbeck re:  Cough/ effusion / L cp  Chief Complaint  Patient presents with  . Follow-up    Cough has improved since the last visit. No new co's today.    Cough mostly daytime with voice use    No obvious day to day or daytime variabilty or assoc sob or cp or chest tightness, subjective wheeze overt sinus or hb symptoms. No unusual exp hx or h/o childhood pna/ asthma or knowledge of premature birth.  Sleeping ok without nocturnal  or early am exacerbation  of respiratory  c/o's or need for  noct saba. Also denies any obvious fluctuation of symptoms with weather or environmental changes or other aggravating or alleviating factors except as outlined above   Current Medications, Allergies, Complete Past Medical History, Past Surgical History, Family History, and Social History were reviewed in [employer] record.  ROS  The following are not active complaints unless bolded sore throat, dysphagia, dental problems, itching, sneezing,  nasal congestion or excess/ purulent secretions, ear ache,   fever, chills, sweats, unintended wt loss, pleuritic or exertional cp, hemoptysis,  orthopnea pnd or leg swelling, presyncope, palpitations, heartburn, abdominal pain, anorexia, nausea, vomiting, diarrhea  or change in bowel or urinary habits, change in stools or urine, dysuria,hematuria,  rash, arthralgias, visual complaints, headache, numbness weakness or ataxia or problems with walking or coordination,  change in mood/affect or memory.               Objective:   Physical Exam amb wf nad  Wt Readings from Last 3 Encounters:  07/25/14 209 lb (94.802 kg)  07/13/14 209 lb 3.2 oz (94.892 kg)  01/11/14 202 lb 9.6 oz (91.899 kg)    Vital signs reviewed       HEENT: nl dentition, turbinates, and orophanx. Nl external ear canals without cough reflex  NECK :  without JVD/Nodes/TM/ nl carotid upstrokes bilaterally   LUNGS: no acc muscle use, clear to A and P bilaterally without cough on insp or exp maneuvers   CV:  RRR  no s3 or murmur or increase in P2, no edema   ABD:  soft and nontender with nl excursion in the supine position. No bruits or organomegaly, bowel sounds nl  MS:  warm without deformities, calf tenderness, cyanosis or clubbing  SKIN: warm and dry without lesions       cxr 07/25/14  Mild left base subsegmental atelectasis and or infiltrate with small left pleural effusion.   Lab Results  Component Value Date   ESRSEDRATE 37* 07/11/2014      Lab Results  Component Value Date   WBC 10.1 07/11/2014   HGB 12.7 07/11/2014   HCT 38.4 07/11/2014   MCV 87.8 07/11/2014   PLT 312.0 07/11/2014       Lab Results  Component Value Date   PROBNP 51.0 07/11/2014     Assessment:

## 2014-07-25 NOTE — Patient Instructions (Addendum)
Please remember to go to the  x-ray department downstairs for your tests - we will call you with the results when they are available.    Late add f/u cxr at 6 weeks, sooner if needed (placed in tickle)

## 2014-07-25 NOTE — Progress Notes (Signed)
Quick Note:  Spoke with pt and notified of results per Dr. Melvyn Novas. Pt verbalized understanding and denied any questions. Pt will call back to schedule 4 wks, did not have time while I was speaking with her ______

## 2014-07-28 NOTE — Assessment & Plan Note (Signed)
Hx even now strongly supports upper airway source > improving on gerd RX  rec no change rx

## 2014-07-28 NOTE — Assessment & Plan Note (Signed)
Onset of cp 05/19/14 with nl cxr at that point  - CT abd 06/28/14 > small effusion - CTa Chest  07/14/2014 >Negative for significant acute pulmonary embolus. Persistent similar sized non loculated small left effusion with minor left lower lobe compressive atelectasis. - 07/19/2014 Venous dopplers neg bilaterally  - 07/25/2014 small residual effusion  I had an extended discussion with the patient reviewing all relevant studies completed to date and  lasting 15 to 20 minutes of a 25 minute visit on the following ongoing concerns:  By exclusion, this must be a small parapneumonic process or hemothorax/ traumatic/ from coughing but does not need further w/u at this point  Would rec final f/u cxr in 4-6 weeks

## 2014-08-18 ENCOUNTER — Telehealth: Payer: Self-pay | Admitting: *Deleted

## 2014-08-18 NOTE — Telephone Encounter (Signed)
I called and spoke with the pt and advised we need to go ahead and set up rov with cxr f/u pleural effusion  She verbalized understanding  States that she will need to call us back to reschedule  Nothing further needed

## 2014-08-18 NOTE — Telephone Encounter (Signed)
-----   Message from Tanda Rockers, MD sent at 07/28/2014  5:36 PM EST ----- F/u cxr Ok [place] due

## 2014-08-29 ENCOUNTER — Telehealth: Payer: Self-pay | Admitting: Internal Medicine

## 2014-08-29 DIAGNOSIS — J90 Pleural effusion, not elsewhere classified: Secondary | ICD-10-CM

## 2014-08-29 NOTE — Telephone Encounter (Signed)
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  Denies fever or sob.  Pt is due for ov with cxr with Dr Melvyn Novas. Ov scheduled 08/30/14.

## 2014-08-30 ENCOUNTER — Ambulatory Visit (INDEPENDENT_AMBULATORY_CARE_PROVIDER_SITE_OTHER)
Admission: RE | Admit: 2014-08-30 | Discharge: 2014-08-30 | Disposition: A | Discharge status: Home / Self Care | Payer: Self-pay | Source: Ambulatory Visit | Attending: Internal Medicine | Admitting: Internal Medicine

## 2014-08-30 ENCOUNTER — Encounter: Payer: Self-pay | Admitting: Internal Medicine

## 2014-08-30 ENCOUNTER — Ambulatory Visit (INDEPENDENT_AMBULATORY_CARE_PROVIDER_SITE_OTHER): Payer: BLUE CROSS/BLUE SHIELD | Admitting: Internal Medicine

## 2014-08-30 VITALS — BP 118/70 | HR 72 | Temp 98.5°F | Ht 63.0 in | Wt 206.0 lb

## 2014-08-30 DIAGNOSIS — R059 Cough, unspecified: Secondary | ICD-10-CM

## 2014-08-30 DIAGNOSIS — J90 Pleural effusion, not elsewhere classified: Secondary | ICD-10-CM

## 2014-08-30 DIAGNOSIS — R05 Cough: Secondary | ICD-10-CM

## 2014-08-30 MED ORDER — PREDNISONE 10 MG PO TABS: ORAL_TABLET | ORAL | Status: DC

## 2014-08-30 NOTE — Patient Instructions (Addendum)
Try prilosec 20mg   Take 30-60 min before first meal of the day and Pepcid 20 mg one bedtime until cough is completely gone for at least a week without the need for cough suppression  Best cough medication is delsym  GERD (REFLUX)  is an extremely common cause of respiratory symptoms just like yours , many times with no obvious heartburn at all.    It can be treated with medication, but also with lifestyle changes including avoidance of late meals, excessive alcohol, smoking cessation, and avoid fatty foods, chocolate, peppermint, colas, red wine, and acidic juices such as orange juice.  NO MINT OR MENTHOL PRODUCTS SO NO COUGH DROPS  USE SUGARLESS CANDY INSTEAD (Jolley ranchers or Stover's or Life Savers) or even ice chips will also do - the key is to swallow to prevent all throat clearing. NO OIL BASED VITAMINS - use powdered substitutes.    If all else fails > Prednisone 10 mg take  4 each am x 2 days,   2 each am x 2 days,  1 each am x 2 days and stop   Pulmonary f/u is as needed
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
## 2014-08-30 NOTE — Telephone Encounter (Signed)
Pt's appt was scheduled for 09/01/14 with MW. Pt came into office today (08/30/14) for cxr and appt with MW. Spoke with MW-ok to add pt to today's schedule and DB. Pt added to MW's schedule today. Nothing further needed.

## 2014-09-01 ENCOUNTER — Ambulatory Visit: Payer: Self-pay | Admitting: Internal Medicine

## 2014-09-02 ENCOUNTER — Encounter: Payer: Self-pay | Admitting: Internal Medicine

## 2014-09-02 NOTE — Assessment & Plan Note (Signed)
-   CT abd 06/28/14 > small effusion - CTa Chest  07/14/2014 >Negative for significant acute pulmonary embolus. Persistent similar sized non loculated small left effusion with minor left lower lobe compressive atelectasis. - 07/19/2014 Venous dopplers neg bilaterally  - 07/25/2014 small residual effusion  Most likely related to a parapneumonic process, not PE/ resolving s intervention

## 2014-09-02 NOTE — Assessment & Plan Note (Signed)
Explained the natural history of uri and why it's necessary in patients at risk to treat GERD aggressively - at least  short term -   to reduce risk of evolving cyclical cough initially  triggered by epithelial injury and a heightened sensitivty to the effects of any upper airway irritants,  most importantly acid - related - then perpetuated by epithelial injury related to the cough itself as the upper airway collapses on itself.  That is, the more sensitive the epithelium becomes once it is damaged by the virus, the more the ensuing irritability> the more the cough, the more the secondary reflux (especially in those prone to reflux) the more the irritation of the sensitive mucosa and so on in a  Classic cyclical pattern.   No further rx or f/u anticipated, we can see her prn

## 2015-06-23 ENCOUNTER — Encounter (HOSPITAL_BASED_OUTPATIENT_CLINIC_OR_DEPARTMENT_OTHER): Payer: Self-pay | Admitting: *Deleted

## 2015-06-23 ENCOUNTER — Emergency Department (HOSPITAL_BASED_OUTPATIENT_CLINIC_OR_DEPARTMENT_OTHER)
Admission: EM | Admit: 2015-06-23 | Discharge: 2015-06-23 | Disposition: A | Discharge status: Home / Self Care | Payer: BLUE CROSS/BLUE SHIELD | Attending: Emergency Medicine | Admitting: Emergency Medicine

## 2015-06-23 DIAGNOSIS — I10 Essential (primary) hypertension: Secondary | ICD-10-CM | POA: Diagnosis not present

## 2015-06-23 DIAGNOSIS — R55 Syncope and collapse: Secondary | ICD-10-CM | POA: Diagnosis not present

## 2015-06-23 DIAGNOSIS — Z8639 Personal history of other endocrine, nutritional and metabolic disease: Secondary | ICD-10-CM | POA: Diagnosis not present

## 2015-06-23 DIAGNOSIS — Z79899 Other long term (current) drug therapy: Secondary | ICD-10-CM | POA: Insufficient documentation

## 2015-06-23 LAB — CBC WITH DIFFERENTIAL/PLATELET
BASOS ABS: 0 10*3/uL (ref 0.0–0.1)
Basophils Relative: 0 %
EOS ABS: 0.2 10*3/uL (ref 0.0–0.7)
Eosinophils Relative: 2 %
HCT: 40.1 % (ref 36.0–46.0)
Hemoglobin: 13.3 g/dL (ref 12.0–15.0)
Lymphocytes Relative: 27 %
Lymphs Abs: 2.9 10*3/uL (ref 0.7–4.0)
MCH: 29.5 pg (ref 26.0–34.0)
MCHC: 33.2 g/dL (ref 30.0–36.0)
MCV: 88.9 fL (ref 78.0–100.0)
Monocytes Absolute: 0.9 10*3/uL (ref 0.1–1.0)
Monocytes Relative: 8 %
NEUTROS PCT: 63 %
Neutro Abs: 6.7 10*3/uL (ref 1.7–7.7)
Platelets: 305 10*3/uL (ref 150–400)
RBC: 4.51 MIL/uL (ref 3.87–5.11)
RDW: 12.6 % (ref 11.5–15.5)
WBC: 10.8 10*3/uL — AB (ref 4.0–10.5)

## 2015-06-23 LAB — TROPONIN I

## 2015-06-23 LAB — URINALYSIS, ROUTINE W REFLEX MICROSCOPIC
Bilirubin Urine: NEGATIVE
Glucose, UA: NEGATIVE mg/dL
Hgb urine dipstick: NEGATIVE
Ketones, ur: 15 mg/dL — AB
Nitrite: NEGATIVE
PROTEIN: NEGATIVE mg/dL
Specific Gravity, Urine: 1.009 (ref 1.005–1.030)
UROBILINOGEN UA: 0.2 mg/dL (ref 0.0–1.0)
pH: 6.5 (ref 5.0–8.0)

## 2015-06-23 LAB — BASIC METABOLIC PANEL
Anion gap: 9 (ref 5–15)
BUN: 24 mg/dL — ABNORMAL HIGH (ref 6–20)
CO2: 26 mmol/L (ref 22–32)
CREATININE: 0.9 mg/dL (ref 0.44–1.00)
Calcium: 9.8 mg/dL (ref 8.9–10.3)
Chloride: 105 mmol/L (ref 101–111)
GFR calc non Af Amer: >60 mL/min (ref >60)
Glucose, Bld: 101 mg/dL — ABNORMAL HIGH (ref 65–99)
Potassium: 4.2 mmol/L (ref 3.5–5.1)
SODIUM: 140 mmol/L (ref 135–145)

## 2015-06-23 LAB — URINE MICROSCOPIC-ADD ON

## 2015-06-23 LAB — CBG MONITORING, ED
Glucose-Capillary: 89 mg/dL (ref 65–99)
Glucose-Capillary: 90 mg/dL (ref 65–99)

## 2015-06-23 MED ORDER — SODIUM CHLORIDE 0.9 % IV BOLUS (SEPSIS): 1000.0000 mL | Freq: Once | INTRAVENOUS | Status: DC
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
## 2015-06-23 NOTE — Discharge Instructions (Signed)
Please follow with your primary care doctor in the next 2 days for a check-up. They must obtain records for further management.   Do not hesitate to return to the Emergency Department for any new, worsening or concerning symptoms.   Syncope Syncope is a medical term for fainting or passing out. This means you lose consciousness and drop to the ground. People are generally unconscious for less than 5 minutes. You may have some muscle twitches for up to 15 seconds before waking up and returning to normal. Syncope occurs more often in older adults, but it can happen to anyone. While most causes of syncope are not dangerous, syncope can be a sign of a serious medical problem. It is important to seek medical care.  CAUSES  Syncope is caused by a sudden drop in blood flow to the brain. The specific cause is often not determined. Factors that can bring on syncope include:  Taking medicines that lower blood pressure.  Sudden changes in posture, such as standing up quickly.  Taking more medicine than prescribed.  Standing in one place for too long.  Seizure disorders.  Dehydration and excessive exposure to heat.  Low blood sugar (hypoglycemia).  Straining to have a bowel movement.  Heart disease, irregular heartbeat, or other circulatory problems.  Fear, emotional distress, seeing blood, or severe pain. SYMPTOMS  Right before fainting, you may:  Feel dizzy or light-headed.  Feel nauseous.  See all white or all black in your field of vision.  Have cold, clammy skin. DIAGNOSIS  Your health care provider will ask about your symptoms, perform a physical exam, and perform an electrocardiogram (ECG) to record the electrical activity of your heart. Your health care provider may also perform other heart or blood tests to determine the cause of your syncope which may include:  Transthoracic echocardiogram (TTE). During echocardiography, sound waves are used to evaluate how blood flows through  your heart.  Transesophageal echocardiogram (TEE).  Cardiac monitoring. This allows your health care provider to monitor your heart rate and rhythm in real time.  Holter monitor. This is a portable device that records your heartbeat and can help diagnose heart arrhythmias. It allows your health care provider to track your heart activity for several days, if needed.  Stress tests by exercise or by giving medicine that makes the heart beat faster. TREATMENT  In most cases, no treatment is needed. Depending on the cause of your syncope, your health care provider may recommend changing or stopping some of your medicines. HOME CARE INSTRUCTIONS  Have someone stay with you until you feel stable.  Do not drive, use machinery, or play sports until your health care provider says it is okay.  Keep all follow-up appointments as directed by your health care provider.  Lie down right away if you start feeling like you might faint. Breathe deeply and steadily. Wait until all the symptoms have passed.  Drink enough fluids to keep your urine clear or pale yellow.  If you are taking blood pressure or heart medicine, get up slowly and take several minutes to sit and then stand. This can reduce dizziness. SEEK IMMEDIATE MEDICAL CARE IF:   You have a severe headache.  You have unusual pain in the chest, abdomen, or back.  You are bleeding from your mouth or rectum, or you have black or tarry stool.  You have an irregular or very fast heartbeat.  You have pain with breathing.  You have repeated fainting or seizure-like jerking during an  episode.  You faint when sitting or lying down.  You have confusion.  You have trouble walking.  You have severe weakness.  You have vision problems. If you fainted, call your local emergency services (911 in [place] Do not drive yourself to the hospital.    This information is not intended to replace advice given to you by your health care provider. Make  sure you discuss any questions you have with your health care provider.   Document Released: 07/28/2005 Document Revised: 12/12/2014 Document Reviewed: 09/26/2011 Elsevier Interactive Patient Education [employer].

## 2016-06-02 ENCOUNTER — Emergency Department (HOSPITAL_COMMUNITY)
Admission: EM | Admit: 2016-06-02 | Discharge: 2016-06-03 | Disposition: A | Discharge status: Home / Self Care | Payer: BLUE CROSS/BLUE SHIELD | Attending: Emergency Medicine | Admitting: Emergency Medicine

## 2016-06-02 ENCOUNTER — Emergency Department (HOSPITAL_COMMUNITY): Payer: BLUE CROSS/BLUE SHIELD

## 2016-06-02 ENCOUNTER — Encounter (HOSPITAL_COMMUNITY): Payer: Self-pay | Admitting: Emergency Medicine

## 2016-06-02 DIAGNOSIS — S52571A Other intraarticular fracture of lower end of right radius, initial encounter for closed fracture: Secondary | ICD-10-CM

## 2016-06-02 DIAGNOSIS — Y939 Activity, unspecified: Secondary | ICD-10-CM | POA: Diagnosis not present

## 2016-06-02 DIAGNOSIS — S59911A Unspecified injury of right forearm, initial encounter: Secondary | ICD-10-CM | POA: Diagnosis present

## 2016-06-02 DIAGNOSIS — Y929 Unspecified place or not applicable: Secondary | ICD-10-CM | POA: Insufficient documentation

## 2016-06-02 DIAGNOSIS — Y999 Unspecified external cause status: Secondary | ICD-10-CM | POA: Diagnosis not present

## 2016-06-02 DIAGNOSIS — I10 Essential (primary) hypertension: Secondary | ICD-10-CM | POA: Diagnosis not present

## 2016-06-02 DIAGNOSIS — K0889 Other specified disorders of teeth and supporting structures: Secondary | ICD-10-CM | POA: Diagnosis not present
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
## 2016-06-03 MED ORDER — OXYCODONE-ACETAMINOPHEN 5-325 MG PO TABS: 1.0000 | ORAL_TABLET | ORAL | 0 refills | Status: AC | PRN
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
## 2017-09-28 ENCOUNTER — Other Ambulatory Visit: Payer: Self-pay | Admitting: Obstetrics

## 2017-09-28 DIAGNOSIS — E2839 Other primary ovarian failure: Secondary | ICD-10-CM

## 2017-10-09 ENCOUNTER — Other Ambulatory Visit: Payer: BLUE CROSS/BLUE SHIELD
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
## 2018-10-01 ENCOUNTER — Other Ambulatory Visit: Payer: Self-pay | Admitting: Obstetrics

## 2018-10-01 DIAGNOSIS — E2839 Other primary ovarian failure: Secondary | ICD-10-CM

## 2018-10-01 DIAGNOSIS — Z780 Asymptomatic menopausal state: Secondary | ICD-10-CM

## 2019-10-13 ENCOUNTER — Ambulatory Visit: Payer: BLUE CROSS/BLUE SHIELD

## 2019-10-17 ENCOUNTER — Ambulatory Visit: Payer: Medicare (Managed Care) | Attending: Internal Medicine

## 2019-10-17 DIAGNOSIS — Z23 Encounter for immunization: Secondary | ICD-10-CM

## 2019-10-17 NOTE — Progress Notes (Signed)
   Covid-19 Vaccination Clinic  Name:  Sherri Kelley    MRN: [id number] DOB: July 29, 1951  10/17/2019  [name] was observed post Covid-19 immunization for 15 minutes without incident. She was provided with Vaccine Information Sheet and instruction to access the V-Safe system.   [name] was instructed to call 911 with any severe reactions post vaccine: Marland Kitchen Difficulty breathing  . Swelling of face and throat  . A fast heartbeat  . A bad rash all over body  . Dizziness and weakness   Immunizations Administered    Name Date Dose VIS Date Route   Pfizer COVID-19 Vaccine 10/17/2019  8:14 AM 0.3 mL 07/22/2019 Intramuscular   Manufacturer: [place]   Lot: [id number]   [place]: [id number]

## 2019-11-08 ENCOUNTER — Ambulatory Visit: Payer: Medicare (Managed Care) | Attending: Internal Medicine

## 2019-11-08 DIAGNOSIS — Z23 Encounter for immunization: Secondary | ICD-10-CM

## 2019-11-08 NOTE — Progress Notes (Signed)
   Covid-19 Vaccination Clinic  Name:  Sherri Kelley    MRN: [id number] DOB: 28-Aug-1950  11/08/2019  Ms. Sherri Kelley was observed post Covid-19 immunization for 15 minutes without incident. She was provided with Vaccine Information Sheet and instruction to access the V-Safe system.   Ms. Sherri Kelley was instructed to call 911 with any severe reactions post vaccine: Marland Kitchen Difficulty breathing  . Swelling of face and throat  . A fast heartbeat  . A bad rash all over body  . Dizziness and weakness   Immunizations Administered    Name Date Dose VIS Date Route   Pfizer COVID-19 Vaccine 11/08/2019  8:08 AM 0.3 mL 07/22/2019 Intramuscular   Manufacturer: [place]   Lot: [id number]   [place]: [id number]

## 2020-01-06 ENCOUNTER — Other Ambulatory Visit: Payer: Self-pay | Admitting: Obstetrics

## 2020-01-06 DIAGNOSIS — E2839 Other primary ovarian failure: Secondary | ICD-10-CM

## 2020-04-12 ENCOUNTER — Other Ambulatory Visit: Payer: Medicare (Managed Care)

## 2020-11-16 DIAGNOSIS — H43811 Vitreous degeneration, right eye: Secondary | ICD-10-CM | POA: Diagnosis not present

## 2020-11-16 DIAGNOSIS — H2513 Age-related nuclear cataract, bilateral: Secondary | ICD-10-CM | POA: Diagnosis not present

## 2020-11-21 DIAGNOSIS — H52209 Unspecified astigmatism, unspecified eye: Secondary | ICD-10-CM | POA: Diagnosis not present

## 2020-11-21 DIAGNOSIS — H5203 Hypermetropia, bilateral: Secondary | ICD-10-CM | POA: Diagnosis not present

## 2020-11-21 DIAGNOSIS — H524 Presbyopia: Secondary | ICD-10-CM | POA: Diagnosis not present

## 2021-01-08 DIAGNOSIS — Z0000 Encounter for general adult medical examination without abnormal findings: Secondary | ICD-10-CM | POA: Diagnosis not present

## 2021-01-08 DIAGNOSIS — Z1212 Encounter for screening for malignant neoplasm of rectum: Secondary | ICD-10-CM | POA: Diagnosis not present

## 2021-01-08 DIAGNOSIS — Z1329 Encounter for screening for other suspected endocrine disorder: Secondary | ICD-10-CM | POA: Diagnosis not present

## 2021-01-08 DIAGNOSIS — Z6833 Body mass index (BMI) 33.0-33.9, adult: Secondary | ICD-10-CM | POA: Diagnosis not present

## 2021-01-08 DIAGNOSIS — Z1211 Encounter for screening for malignant neoplasm of colon: Secondary | ICD-10-CM | POA: Diagnosis not present

## 2021-01-08 DIAGNOSIS — Z01411 Encounter for gynecological examination (general) (routine) with abnormal findings: Secondary | ICD-10-CM | POA: Diagnosis not present

## 2021-01-08 DIAGNOSIS — Z130 Encounter for screening for diseases of the blood and blood-forming organs and certain disorders involving the immune mechanism: Secondary | ICD-10-CM | POA: Diagnosis not present

## 2021-01-08 DIAGNOSIS — Z01419 Encounter for gynecological examination (general) (routine) without abnormal findings: Secondary | ICD-10-CM | POA: Diagnosis not present

## 2021-01-08 DIAGNOSIS — Z13220 Encounter for screening for lipoid disorders: Secondary | ICD-10-CM | POA: Diagnosis not present

## 2021-01-08 DIAGNOSIS — Z1231 Encounter for screening mammogram for malignant neoplasm of breast: Secondary | ICD-10-CM | POA: Diagnosis not present

## 2021-01-08 DIAGNOSIS — Z131 Encounter for screening for diabetes mellitus: Secondary | ICD-10-CM | POA: Diagnosis not present

## 2021-01-08 DIAGNOSIS — Z124 Encounter for screening for malignant neoplasm of cervix: Secondary | ICD-10-CM | POA: Diagnosis not present

## 2021-02-01 DIAGNOSIS — Z20828 Contact with and (suspected) exposure to other viral communicable diseases: Secondary | ICD-10-CM | POA: Diagnosis not present

## 2021-02-27 DIAGNOSIS — H2513 Age-related nuclear cataract, bilateral: Secondary | ICD-10-CM | POA: Diagnosis not present

## 2021-02-27 DIAGNOSIS — L821 Other seborrheic keratosis: Secondary | ICD-10-CM | POA: Diagnosis not present

## 2021-02-27 DIAGNOSIS — L578 Other skin changes due to chronic exposure to nonionizing radiation: Secondary | ICD-10-CM | POA: Diagnosis not present

## 2021-02-27 DIAGNOSIS — D225 Melanocytic nevi of trunk: Secondary | ICD-10-CM | POA: Diagnosis not present

## 2021-02-27 DIAGNOSIS — L814 Other melanin hyperpigmentation: Secondary | ICD-10-CM | POA: Diagnosis not present

## 2021-02-27 DIAGNOSIS — Q825 Congenital non-neoplastic nevus: Secondary | ICD-10-CM | POA: Diagnosis not present

## 2021-02-27 DIAGNOSIS — H43811 Vitreous degeneration, right eye: Secondary | ICD-10-CM | POA: Diagnosis not present

## 2021-04-02 DIAGNOSIS — E669 Obesity, unspecified: Secondary | ICD-10-CM | POA: Diagnosis not present

## 2021-04-02 DIAGNOSIS — Z823 Family history of stroke: Secondary | ICD-10-CM | POA: Diagnosis not present

## 2021-04-02 DIAGNOSIS — Z8249 Family history of ischemic heart disease and other diseases of the circulatory system: Secondary | ICD-10-CM | POA: Diagnosis not present

## 2021-04-02 DIAGNOSIS — R030 Elevated blood-pressure reading, without diagnosis of hypertension: Secondary | ICD-10-CM | POA: Diagnosis not present

## 2021-04-02 DIAGNOSIS — Z833 Family history of diabetes mellitus: Secondary | ICD-10-CM | POA: Diagnosis not present

## 2021-04-02 DIAGNOSIS — R69 Illness, unspecified: Secondary | ICD-10-CM | POA: Diagnosis not present

## 2021-04-02 DIAGNOSIS — Z6834 Body mass index (BMI) 34.0-34.9, adult: Secondary | ICD-10-CM | POA: Diagnosis not present

## 2021-11-21 DIAGNOSIS — R059 Cough, unspecified: Secondary | ICD-10-CM | POA: Diagnosis not present

## 2021-11-21 DIAGNOSIS — Z20822 Contact with and (suspected) exposure to covid-19: Secondary | ICD-10-CM | POA: Diagnosis not present

## 2021-11-21 DIAGNOSIS — J029 Acute pharyngitis, unspecified: Secondary | ICD-10-CM | POA: Diagnosis not present

## 2022-03-11 DIAGNOSIS — Q825 Congenital non-neoplastic nevus: Secondary | ICD-10-CM | POA: Diagnosis not present

## 2022-03-11 DIAGNOSIS — L719 Rosacea, unspecified: Secondary | ICD-10-CM | POA: Diagnosis not present

## 2022-03-11 DIAGNOSIS — L814 Other melanin hyperpigmentation: Secondary | ICD-10-CM | POA: Diagnosis not present

## 2022-03-11 DIAGNOSIS — D225 Melanocytic nevi of trunk: Secondary | ICD-10-CM | POA: Diagnosis not present

## 2022-03-11 DIAGNOSIS — L820 Inflamed seborrheic keratosis: Secondary | ICD-10-CM | POA: Diagnosis not present

## 2022-03-11 DIAGNOSIS — L578 Other skin changes due to chronic exposure to nonionizing radiation: Secondary | ICD-10-CM | POA: Diagnosis not present

## 2022-03-11 DIAGNOSIS — L821 Other seborrheic keratosis: Secondary | ICD-10-CM | POA: Diagnosis not present

## 2022-03-31 DIAGNOSIS — Z1212 Encounter for screening for malignant neoplasm of rectum: Secondary | ICD-10-CM | POA: Diagnosis not present

## 2022-03-31 DIAGNOSIS — Z124 Encounter for screening for malignant neoplasm of cervix: Secondary | ICD-10-CM | POA: Diagnosis not present

## 2022-03-31 DIAGNOSIS — Z1211 Encounter for screening for malignant neoplasm of colon: Secondary | ICD-10-CM | POA: Diagnosis not present

## 2022-03-31 DIAGNOSIS — Z01411 Encounter for gynecological examination (general) (routine) with abnormal findings: Secondary | ICD-10-CM | POA: Diagnosis not present

## 2022-03-31 DIAGNOSIS — Z0142 Encounter for cervical smear to confirm findings of recent normal smear following initial abnormal smear: Secondary | ICD-10-CM | POA: Diagnosis not present

## 2022-03-31 DIAGNOSIS — Z1231 Encounter for screening mammogram for malignant neoplasm of breast: Secondary | ICD-10-CM | POA: Diagnosis not present

## 2022-03-31 DIAGNOSIS — Z0000 Encounter for general adult medical examination without abnormal findings: Secondary | ICD-10-CM | POA: Diagnosis not present

## 2022-03-31 DIAGNOSIS — Z01419 Encounter for gynecological examination (general) (routine) without abnormal findings: Secondary | ICD-10-CM | POA: Diagnosis not present

## 2022-03-31 DIAGNOSIS — Z6830 Body mass index (BMI) 30.0-30.9, adult: Secondary | ICD-10-CM | POA: Diagnosis not present

## 2022-03-31 DIAGNOSIS — Z13820 Encounter for screening for osteoporosis: Secondary | ICD-10-CM | POA: Diagnosis not present

## 2022-04-01 ENCOUNTER — Other Ambulatory Visit: Payer: Self-pay | Admitting: Obstetrics

## 2022-04-01 DIAGNOSIS — Z13820 Encounter for screening for osteoporosis: Secondary | ICD-10-CM

## 2022-04-21 DIAGNOSIS — H2513 Age-related nuclear cataract, bilateral: Secondary | ICD-10-CM | POA: Diagnosis not present

## 2022-04-21 DIAGNOSIS — H18453 Nodular corneal degeneration, bilateral: Secondary | ICD-10-CM | POA: Diagnosis not present

## 2022-05-19 DIAGNOSIS — H2513 Age-related nuclear cataract, bilateral: Secondary | ICD-10-CM | POA: Diagnosis not present

## 2022-06-04 DIAGNOSIS — H2511 Age-related nuclear cataract, right eye: Secondary | ICD-10-CM | POA: Diagnosis not present

## 2022-06-04 DIAGNOSIS — H269 Unspecified cataract: Secondary | ICD-10-CM | POA: Diagnosis not present

## 2022-06-11 DIAGNOSIS — H2512 Age-related nuclear cataract, left eye: Secondary | ICD-10-CM | POA: Diagnosis not present

## 2022-06-11 DIAGNOSIS — H269 Unspecified cataract: Secondary | ICD-10-CM | POA: Diagnosis not present

## 2022-11-10 DIAGNOSIS — Z961 Presence of intraocular lens: Secondary | ICD-10-CM | POA: Diagnosis not present

## 2022-11-10 DIAGNOSIS — H04123 Dry eye syndrome of bilateral lacrimal glands: Secondary | ICD-10-CM | POA: Diagnosis not present
